# Patient Record
Sex: Male | Born: 1985 | Race: Black or African American | Hispanic: No | Marital: Single | Smoking: Never smoker
Health system: Southern US, Community
[De-identification: ages and names within clinical notes are randomized; demographics above are authoritative.]

## PROBLEM LIST (undated history)

## (undated) DIAGNOSIS — I4891 Unspecified atrial fibrillation: Secondary | ICD-10-CM

## (undated) DIAGNOSIS — G40909 Epilepsy, unspecified, not intractable, without status epilepticus: Secondary | ICD-10-CM

---
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
## 2007-04-21 ENCOUNTER — Emergency Department (HOSPITAL_COMMUNITY): Admission: EM | Admit: 2007-04-21 | Discharge: 2007-04-21 | Payer: Self-pay | Admitting: Emergency Medicine

## 2008-02-01 ENCOUNTER — Emergency Department (HOSPITAL_COMMUNITY): Admission: EM | Admit: 2008-02-01 | Discharge: 2008-02-02 | Payer: Self-pay | Admitting: Emergency Medicine

## 2008-09-20 IMAGING — CT CT HEAD W/O CM
1 of 2 series · 13 of 30 positions shown, 17 images · non-contrast
Comparison: none

CLINICAL DATA: Seizure. Headache.
 HEAD CT WITHOUT CONTRAST ? 02/11/06:
TECHNIQUE: Contiguous axial CT images were obtained from the base of the skull through the vertex according to standard protocol without contrast.

[Series 2: brain · axial · 0.47mm/px · z∈[+145,+268]mm · 13 of 28 slices shown, 17 images]
[im 2/28  brain]
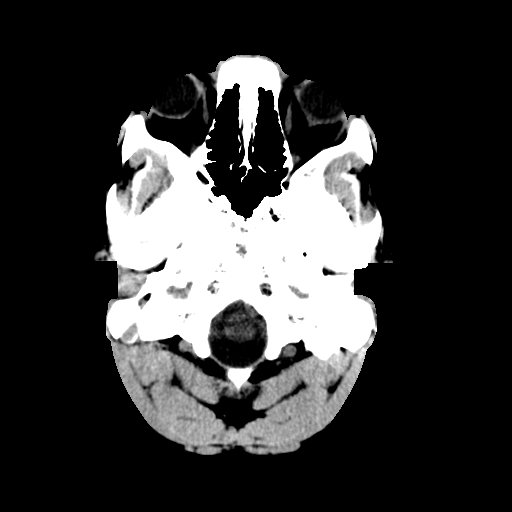
[im 2/28  bone]
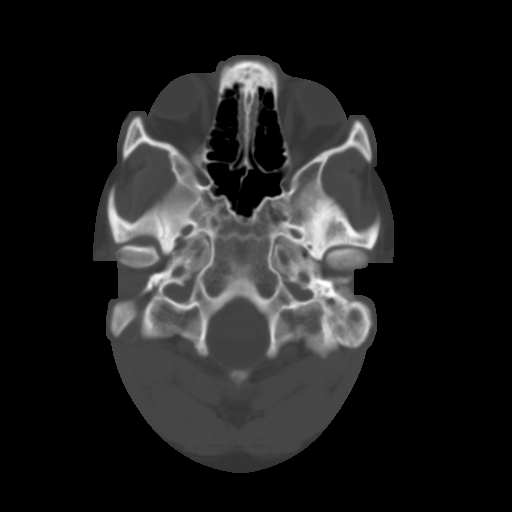
[im 4/28  brain]
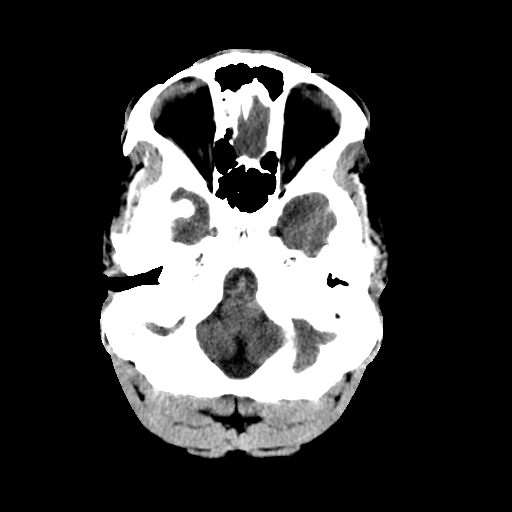
[im 6/28  brain]
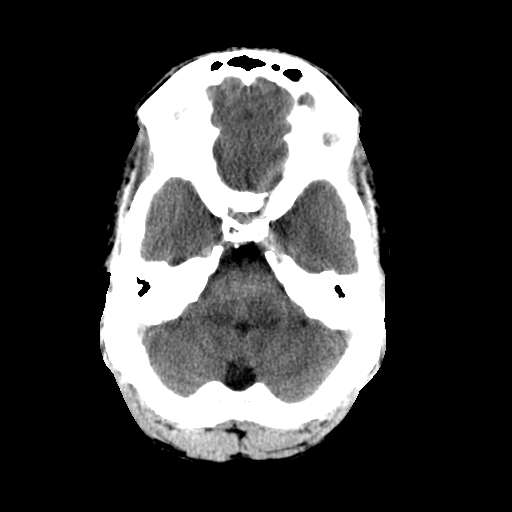
[im 8/28  brain]
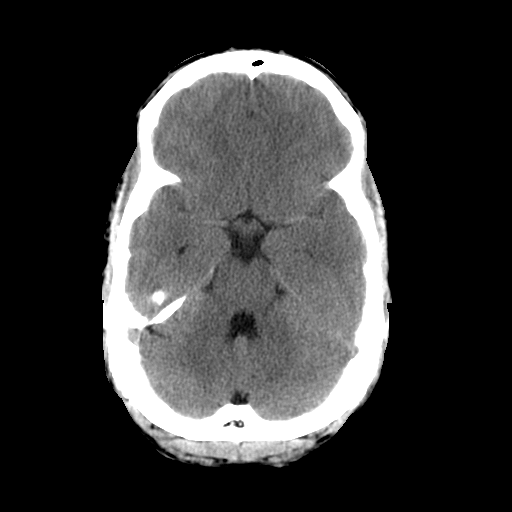
[im 10/28  brain]
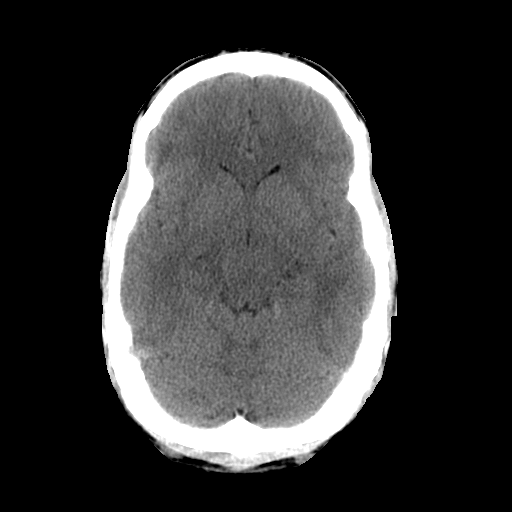
[im 10/28  bone]
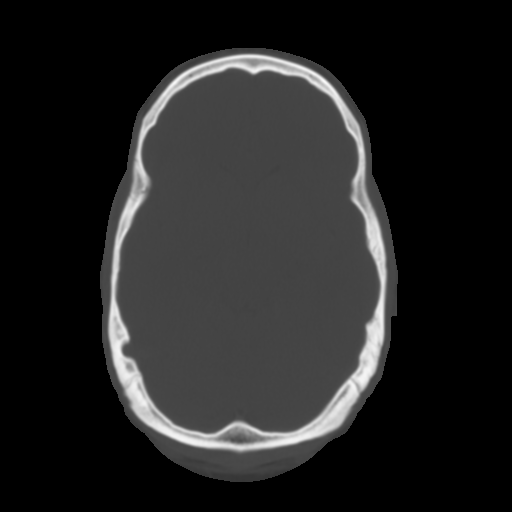
[im 12/28  brain]
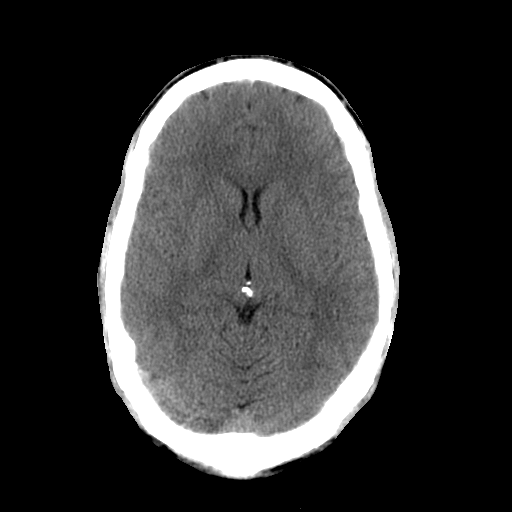
[im 14/28  brain]
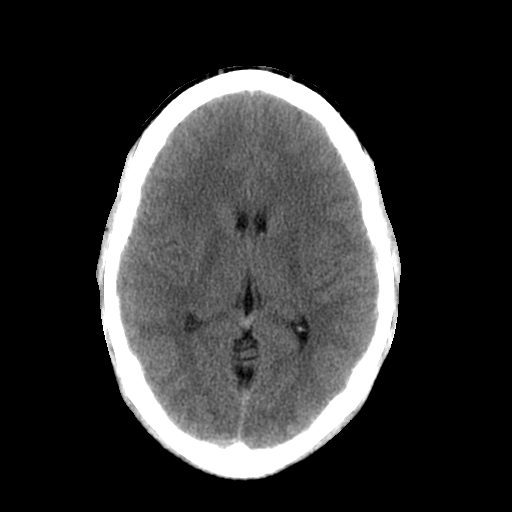
[im 16/28  brain]
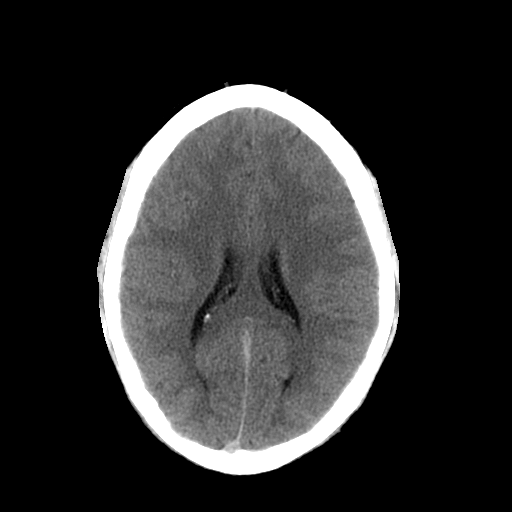
[im 18/28  brain]
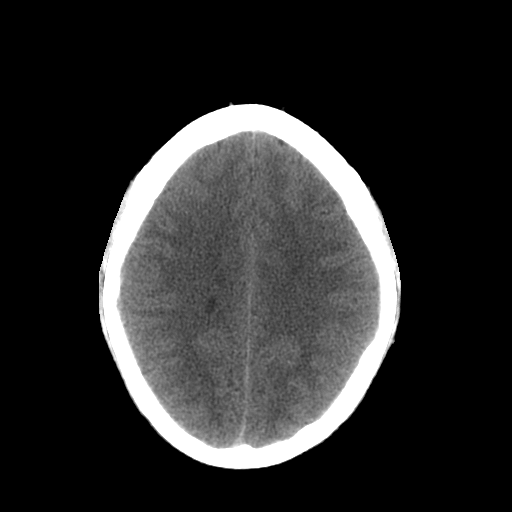
[im 18/28  bone]
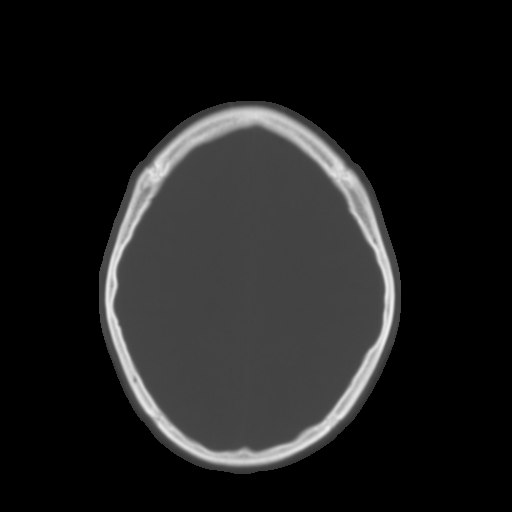
[im 20/28  brain]
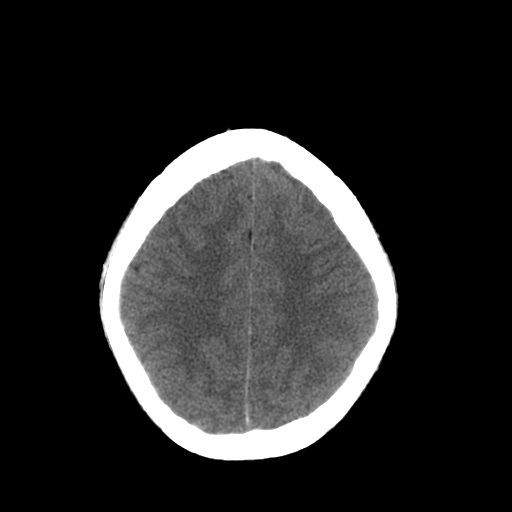
[im 22/28  brain]
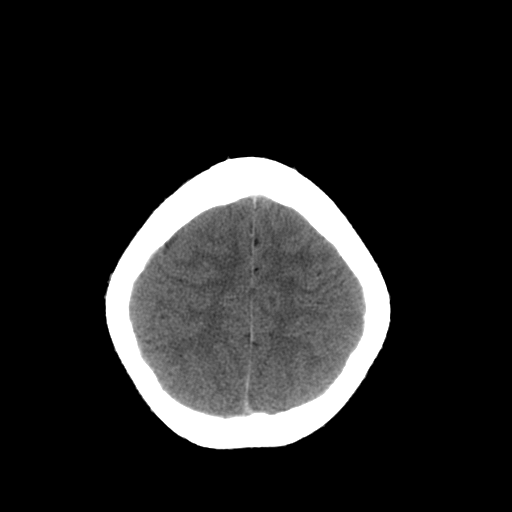
[im 24/28  brain]
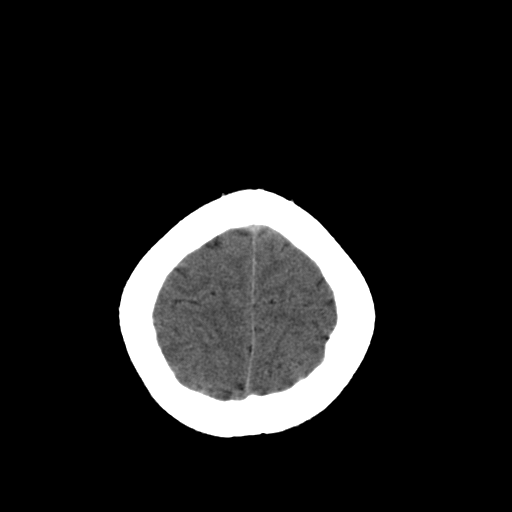
[im 26/28  brain]
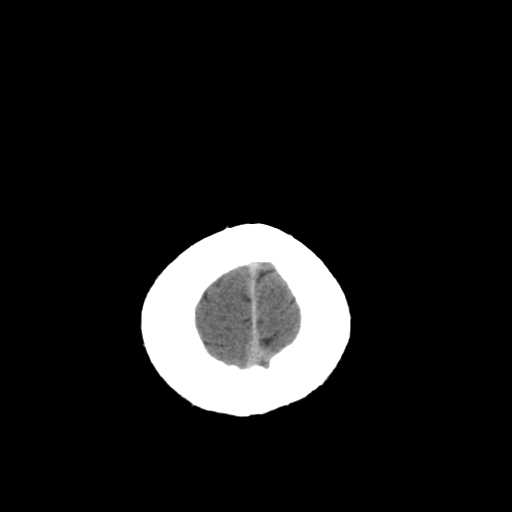
[im 26/28  bone]
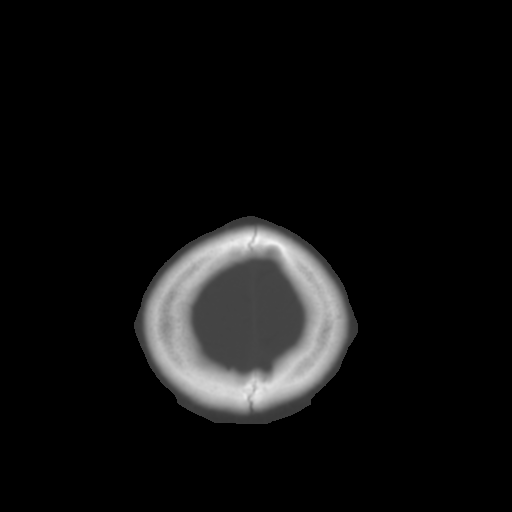

[13 of 30 positions shown; findings below may reference images not displayed]

FINDINGS: Axial scans from the base to the vertex were performed in the unenhanced state.   The ventricular system is normal in size and configuration and the septum is in a normal midline position.   The fourth ventricle and basilar cisterns appear normal.   No blood, edema, or mass effect is seen.  No bony abnormality is noted.
IMPRESSION: No acute intracranial abnormality.

## 2009-01-06 IMAGING — CT CT HEAD W/O CM
1 series · 16 of 30 positions shown, 20 images · IV contrast (agent unspecified)
Comparison: None.

CLINICAL DATA: Seizure/headache. 
 HEAD CT WITHOUT CONTRAST:
TECHNIQUE: Contiguous axial images were obtained from the base of the skull through the vertex according to standard protocol without contrast.

[Series 2: headseq 4.8 h45s · axial · 0.43mm/px · z∈[-157,-5]mm · 16 of 36 slices shown, 20 images]
[im 2/36  brain]
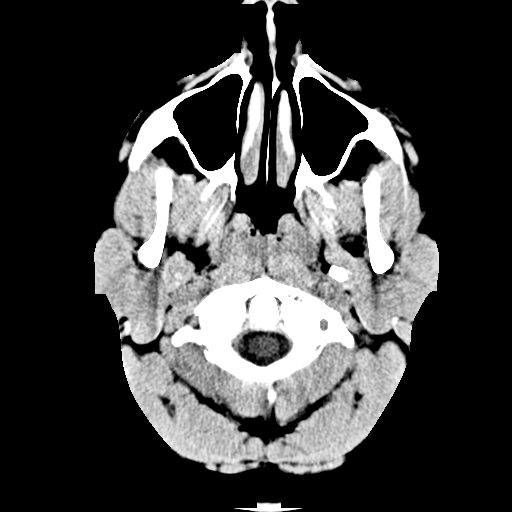
[im 2/36  bone]
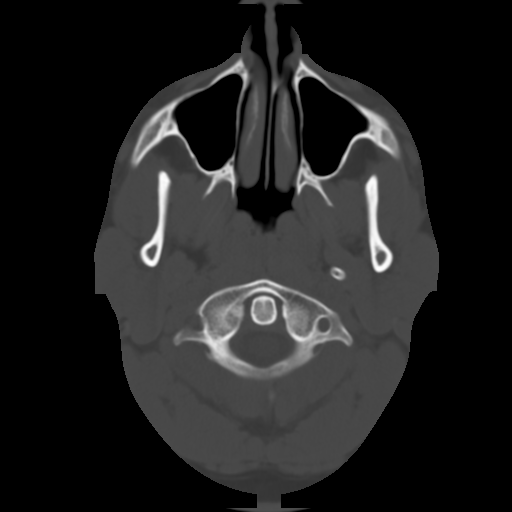
[im 4/36  brain]
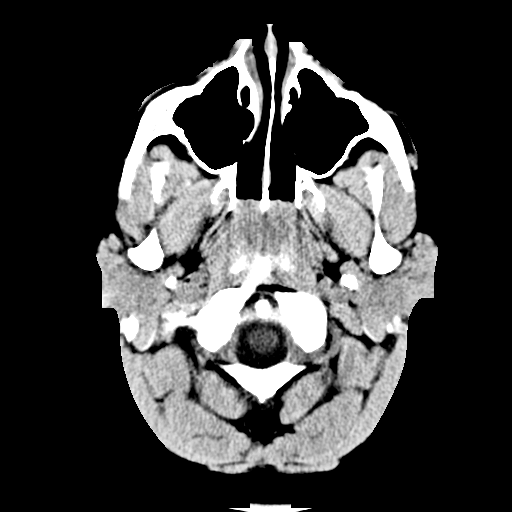
[im 7/36  brain]
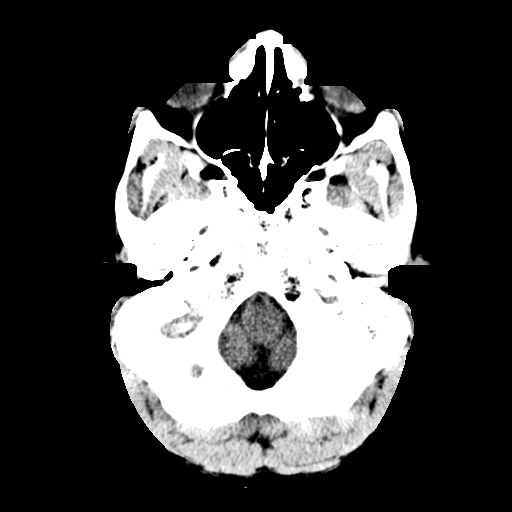
[im 9/36  brain]
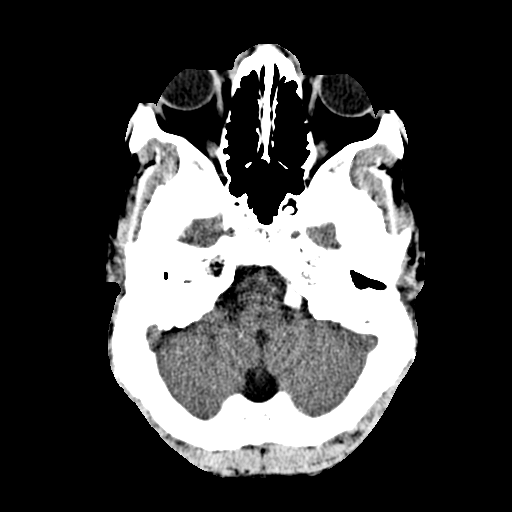
[im 10/36  brain]
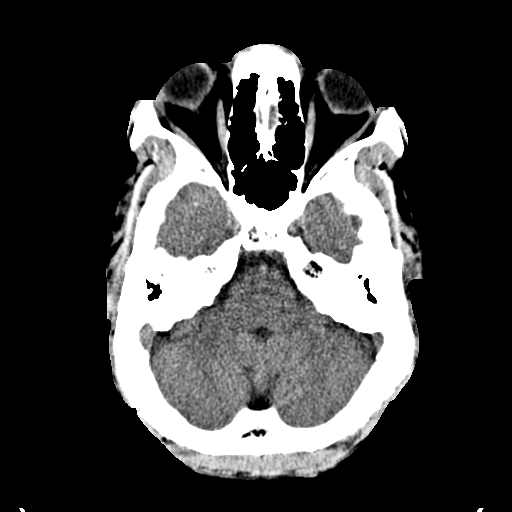
[im 10/36  bone]
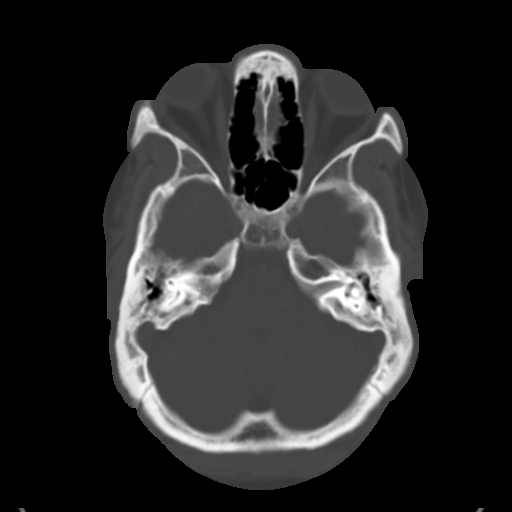
[im 13/36  brain]
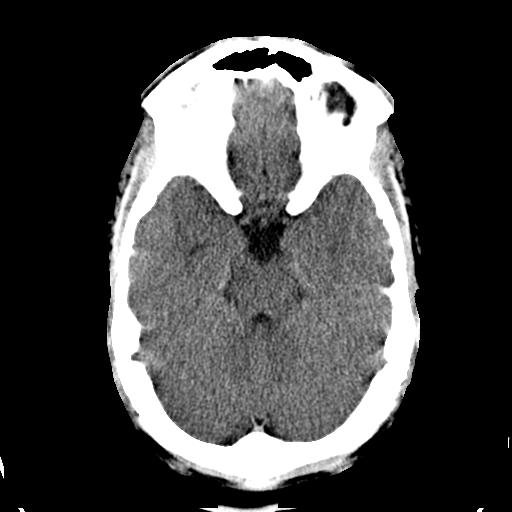
[im 15/36  brain]
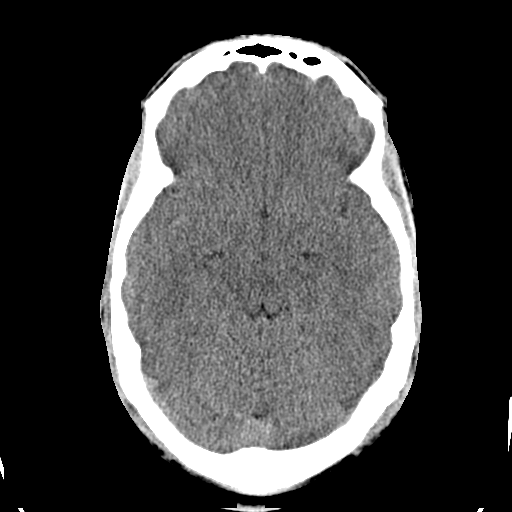
[im 17/36  brain]
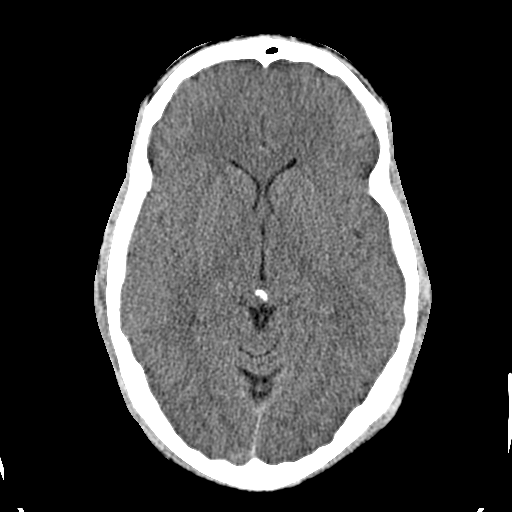
[im 19/36  brain]
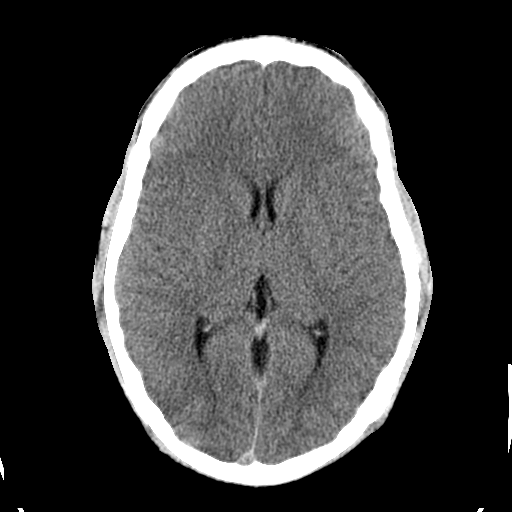
[im 19/36  bone]
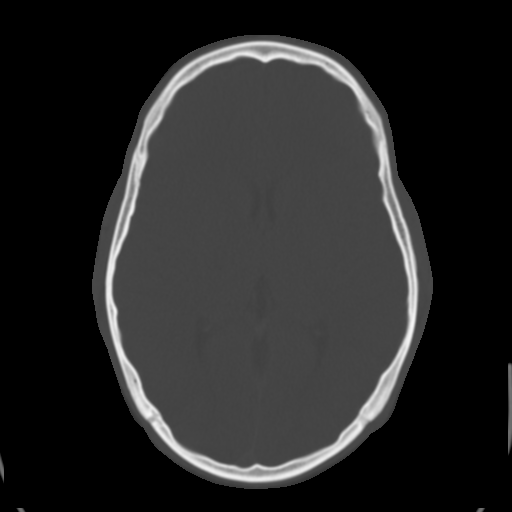
[im 21/36  brain]
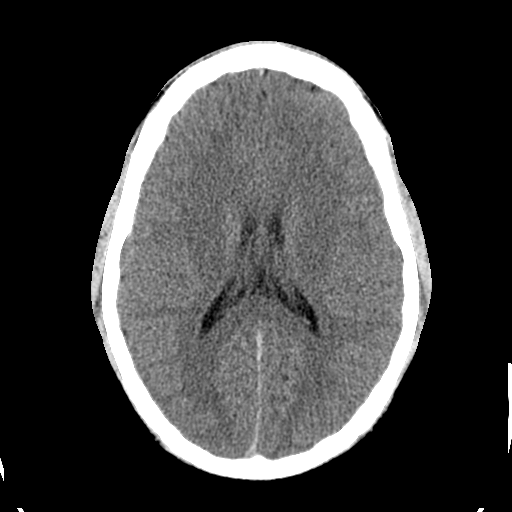
[im 23/36  brain]
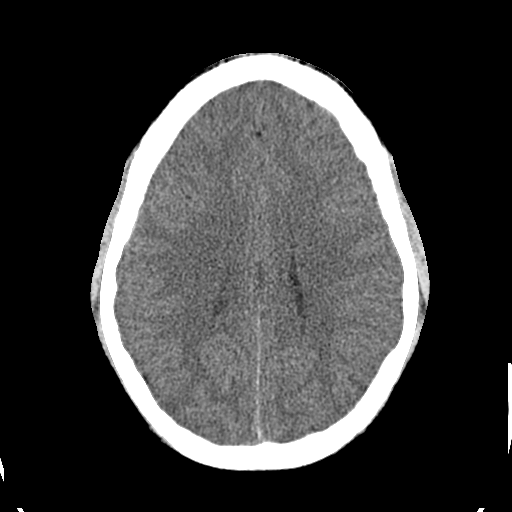
[im 26/36  brain]
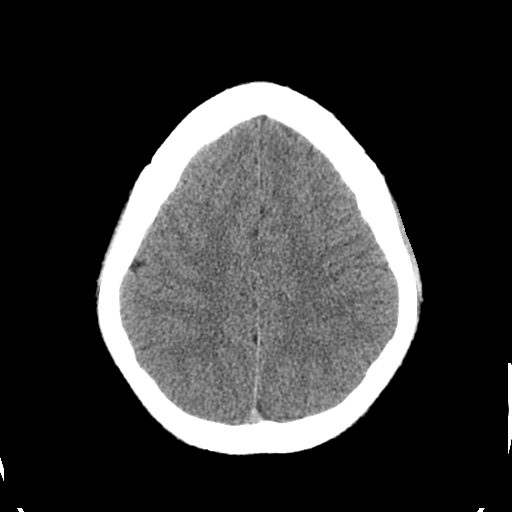
[im 27/36  brain]
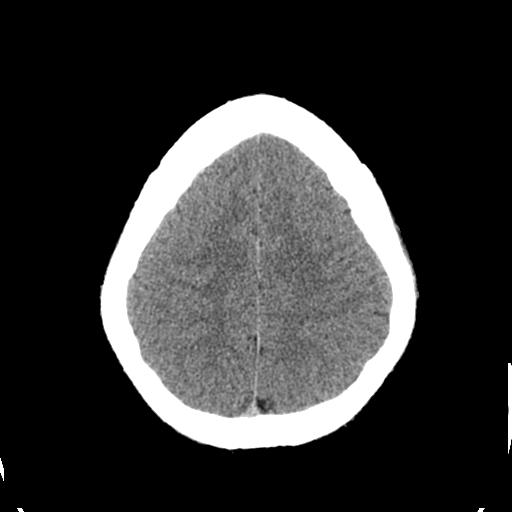
[im 27/36  bone]
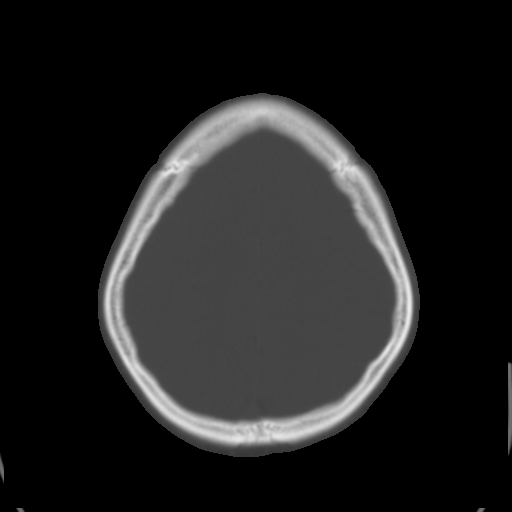
[im 29/36  brain]
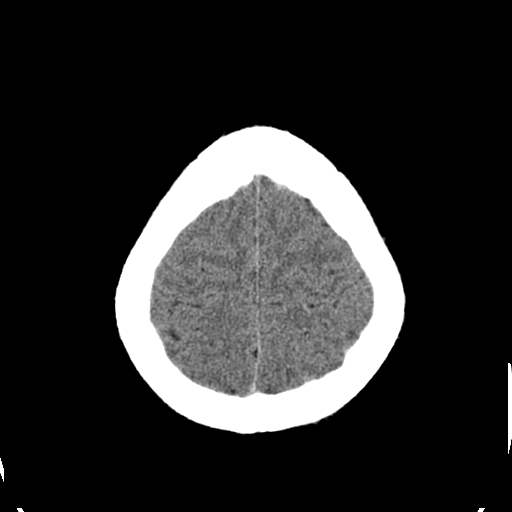
[im 32/36  brain]
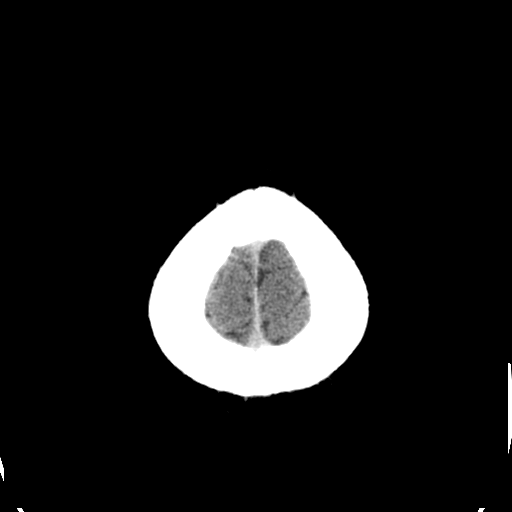
[im 34/36  brain]
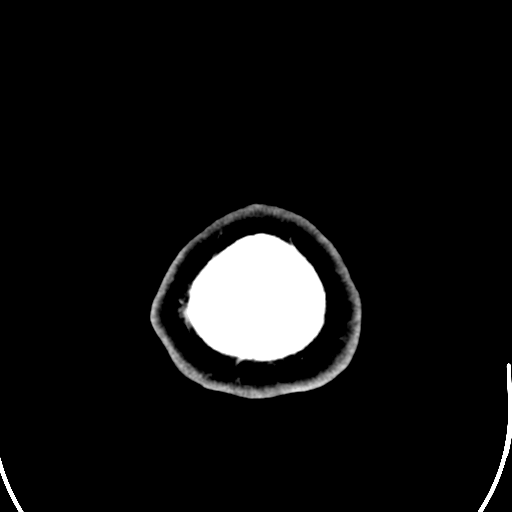

[16 of 30 positions shown; findings below may reference images not displayed]

FINDINGS: There is no evidence of intracranial hemorrhage, brain edema, acute infarct, mass lesion, or mass effect.  No other intra-axial abnormalities are seen, and the ventricles are within normal limits.  No abnormal extra-axial fluid collections or masses are identified.  No skull abnormalities are noted.
IMPRESSION: Negative non-contrast head CT.

## 2010-05-03 LAB — DIFFERENTIAL
Basophils Absolute: 0 10*3/uL (ref 0.0–0.1)
Lymphocytes Relative: 30 % (ref 12–46)
Lymphs Abs: 2 10*3/uL (ref 0.7–4.0)
Monocytes Absolute: 0.8 10*3/uL (ref 0.1–1.0)
Monocytes Relative: 11 % (ref 3–12)
Neutro Abs: 3.9 10*3/uL (ref 1.7–7.7)

## 2010-05-03 LAB — POCT I-STAT, CHEM 8
Calcium, Ion: 1.15 mmol/L (ref 1.12–1.32)
Glucose, Bld: 105 mg/dL — ABNORMAL HIGH (ref 70–99)
HCT: 47 % (ref 39.0–52.0)
Hemoglobin: 16 g/dL (ref 13.0–17.0)
Potassium: 4.3 mEq/L (ref 3.5–5.1)
Sodium: 140 mEq/L (ref 135–145)
TCO2: 23 mmol/L (ref 0–100)

## 2010-05-03 LAB — CBC
Hemoglobin: 15.2 g/dL (ref 13.0–17.0)
MCV: 89.8 fL (ref 78.0–100.0)
RBC: 5.07 MIL/uL (ref 4.22–5.81)
RDW: 12.8 % (ref 11.5–15.5)

## 2010-05-03 LAB — PHENYTOIN LEVEL, TOTAL: Phenytoin Lvl: <2.5 ug/mL — ABNORMAL LOW (ref 10.0–20.0)

## 2010-10-12 LAB — BASIC METABOLIC PANEL
BUN: 8
CO2: 26
Calcium: 9
Chloride: 105
Creatinine, Ser: 0.93
GFR calc Af Amer: >60
GFR calc non Af Amer: >60
Glucose, Bld: 102 — ABNORMAL HIGH
Potassium: 3.9
Sodium: 138

## 2010-10-12 LAB — PHENYTOIN LEVEL, TOTAL: Phenytoin Lvl: <2.5 — ABNORMAL LOW

## 2010-10-27 LAB — I-STAT 8, (EC8 V) (CONVERTED LAB)
Chloride: 104
HCT: 47
Operator id: 257131
TCO2: 26
pCO2, Ven: 47.9

## 2010-10-27 LAB — URINALYSIS, ROUTINE W REFLEX MICROSCOPIC
Bilirubin Urine: NEGATIVE
Glucose, UA: NEGATIVE
Hgb urine dipstick: NEGATIVE
Ketones, ur: 15 — AB
Nitrite: NEGATIVE
Protein, ur: 30 — AB
Urobilinogen, UA: 1

## 2010-10-27 LAB — URINE MICROSCOPIC-ADD ON

## 2010-10-27 LAB — PHENYTOIN LEVEL, TOTAL: Phenytoin Lvl: <2.5 — ABNORMAL LOW

## 2016-04-14 ENCOUNTER — Telehealth: Payer: Self-pay | Admitting: *Deleted

## 2016-04-14 NOTE — Telephone Encounter (Signed)
Per staff message, called pt to discuss drop in Hgb.  Pt stated she had no signs or symptoms of bleeding.  No blood in stools noted, last period was "light".  Pt will come in for lab redraw tomorrow 3/30.  Instructed pt to begin Iron Polysaccharide 150mg  BID with orange juice.  Will call pt with lab results.  Pt notified Dr. Candise CheKale will be out of town next week, will set up IV iron when he returns if needed.  Pt verbalized understanding.  Scheduling msg sent for lab apt, lab orders placed.

## 2022-06-20 ENCOUNTER — Emergency Department (HOSPITAL_BASED_OUTPATIENT_CLINIC_OR_DEPARTMENT_OTHER): Payer: 59

## 2022-06-20 ENCOUNTER — Other Ambulatory Visit: Payer: Self-pay

## 2022-06-20 ENCOUNTER — Other Ambulatory Visit (HOSPITAL_BASED_OUTPATIENT_CLINIC_OR_DEPARTMENT_OTHER): Payer: Self-pay

## 2022-06-20 ENCOUNTER — Emergency Department (HOSPITAL_BASED_OUTPATIENT_CLINIC_OR_DEPARTMENT_OTHER)
Admission: EM | Admit: 2022-06-20 | Discharge: 2022-06-20 | Disposition: A | Discharge status: Home / Self Care | Payer: 59 | Attending: Emergency Medicine | Admitting: Emergency Medicine

## 2022-06-20 ENCOUNTER — Encounter (HOSPITAL_BASED_OUTPATIENT_CLINIC_OR_DEPARTMENT_OTHER): Payer: Self-pay | Admitting: Emergency Medicine

## 2022-06-20 DIAGNOSIS — R509 Fever, unspecified: Secondary | ICD-10-CM

## 2022-06-20 DIAGNOSIS — Z20822 Contact with and (suspected) exposure to covid-19: Secondary | ICD-10-CM | POA: Diagnosis not present

## 2022-06-20 DIAGNOSIS — H6121 Impacted cerumen, right ear: Secondary | ICD-10-CM | POA: Diagnosis not present

## 2022-06-20 DIAGNOSIS — R002 Palpitations: Secondary | ICD-10-CM | POA: Insufficient documentation

## 2022-06-20 DIAGNOSIS — R590 Localized enlarged lymph nodes: Secondary | ICD-10-CM | POA: Diagnosis not present

## 2022-06-20 DIAGNOSIS — J392 Other diseases of pharynx: Secondary | ICD-10-CM | POA: Diagnosis not present

## 2022-06-20 DIAGNOSIS — R110 Nausea: Secondary | ICD-10-CM | POA: Insufficient documentation

## 2022-06-20 HISTORY — DX: Epilepsy, unspecified, not intractable, without status epilepticus: G40.909

## 2022-06-20 HISTORY — DX: Unspecified atrial fibrillation: I48.91

## 2022-06-20 LAB — URINALYSIS, W/ REFLEX TO CULTURE (INFECTION SUSPECTED)
Bacteria, UA: NONE SEEN
Bilirubin Urine: NEGATIVE
Glucose, UA: NEGATIVE mg/dL
Ketones, ur: 40 mg/dL — AB
Leukocytes,Ua: NEGATIVE
Nitrite: NEGATIVE
Protein, ur: 100 mg/dL — AB
Specific Gravity, Urine: 1.019 (ref 1.005–1.030)
pH: 6 (ref 5.0–8.0)

## 2022-06-20 LAB — CBC WITH DIFFERENTIAL/PLATELET
Abs Immature Granulocytes: 0.02 10*3/uL (ref 0.00–0.07)
Basophils Absolute: 0 10*3/uL (ref 0.0–0.1)
Basophils Relative: 1 %
Eosinophils Absolute: 0 10*3/uL (ref 0.0–0.5)
Eosinophils Relative: 0 %
HCT: 43.9 % (ref 39.0–52.0)
Hemoglobin: 15.1 g/dL (ref 13.0–17.0)
Immature Granulocytes: 0 %
Lymphocytes Relative: 8 %
Lymphs Abs: 0.5 10*3/uL — ABNORMAL LOW (ref 0.7–4.0)
MCH: 29.8 pg (ref 26.0–34.0)
MCHC: 34.4 g/dL (ref 30.0–36.0)
MCV: 86.8 fL (ref 80.0–100.0)
Monocytes Absolute: 1 10*3/uL (ref 0.1–1.0)
Monocytes Relative: 19 %
Neutro Abs: 4 10*3/uL (ref 1.7–7.7)
Neutrophils Relative %: 72 %
Platelets: 171 10*3/uL (ref 150–400)
RBC: 5.06 MIL/uL (ref 4.22–5.81)
RDW: 13.5 % (ref 11.5–15.5)
WBC: 5.6 10*3/uL (ref 4.0–10.5)
nRBC: 0 % (ref 0.0–0.2)

## 2022-06-20 LAB — COMPREHENSIVE METABOLIC PANEL
ALT: 11 U/L (ref 0–44)
AST: 16 U/L (ref 15–41)
Albumin: 4.7 g/dL (ref 3.5–5.0)
Alkaline Phosphatase: 54 U/L (ref 38–126)
Anion gap: 14 (ref 5–15)
BUN: 10 mg/dL (ref 6–20)
CO2: 23 mmol/L (ref 22–32)
Calcium: 9.3 mg/dL (ref 8.9–10.3)
Chloride: 97 mmol/L — ABNORMAL LOW (ref 98–111)
Creatinine, Ser: 1.04 mg/dL (ref 0.61–1.24)
GFR, Estimated: >60 mL/min (ref >60)
Glucose, Bld: 87 mg/dL (ref 70–99)
Potassium: 3.5 mmol/L (ref 3.5–5.1)
Sodium: 134 mmol/L — ABNORMAL LOW (ref 135–145)
Total Bilirubin: 0.9 mg/dL (ref 0.3–1.2)
Total Protein: 7.8 g/dL (ref 6.5–8.1)

## 2022-06-20 LAB — GROUP A STREP BY PCR: Group A Strep by PCR: NOT DETECTED

## 2022-06-20 LAB — RESP PANEL BY RT-PCR (RSV, FLU A&B, COVID)  RVPGX2
Influenza A by PCR: NEGATIVE
Influenza B by PCR: NEGATIVE
Resp Syncytial Virus by PCR: NEGATIVE
SARS Coronavirus 2 by RT PCR: NEGATIVE

## 2022-06-20 LAB — CULTURE, BLOOD (ROUTINE X 2)

## 2022-06-20 LAB — LACTIC ACID, PLASMA: Lactic Acid, Venous: 1.1 mmol/L (ref 0.5–1.9)

## 2022-06-20 LAB — HIV ANTIBODY (ROUTINE TESTING W REFLEX): HIV Screen 4th Generation wRfx: NONREACTIVE

## 2022-06-20 LAB — PROTIME-INR
INR: 1.1 (ref 0.8–1.2)
Prothrombin Time: 14.1 seconds (ref 11.4–15.2)

## 2022-06-20 LAB — APTT: aPTT: 25 seconds (ref 24–36)

## 2022-06-20 MED ORDER — DOXYCYCLINE HYCLATE 100 MG PO CAPS
100.0000 mg | ORAL_CAPSULE | Freq: Two times a day (BID) | ORAL | 0 refills | Status: DC
  Filled 2022-06-20: qty 20, 10d supply, fill #0

## 2022-06-20 MED ORDER — IBUPROFEN 800 MG PO TABS
800.0000 mg | ORAL_TABLET | Freq: Once | ORAL | Status: AC
  Administered 2022-06-20: 800 mg via ORAL
  Filled 2022-06-20: qty 1

## 2022-06-20 MED ORDER — DOXYCYCLINE HYCLATE 100 MG PO CAPS: 100.0000 mg | ORAL_CAPSULE | Freq: Two times a day (BID) | ORAL | 0 refills | Status: AC

## 2022-06-20 MED ORDER — ACETAMINOPHEN 325 MG PO TABS
650.0000 mg | ORAL_TABLET | Freq: Once | ORAL | Status: AC | PRN
  Administered 2022-06-20: 650 mg via ORAL
  Filled 2022-06-20: qty 2

## 2022-06-20 NOTE — ED Provider Notes (Signed)
[place] EMERGENCY DEPARTMENT AT [facility] Provider Note   CSN: [id number] Arrival date & time: 06/20/22  1135     History  Chief Complaint  Patient presents with   Fever    Darrell Hendricks is a 37 y.o. malepresents to the ED for chief complaint of fever x 1 day. While he was cooking dinner last night he suddenly felt warm and lightheaded. His wife took his temperature and stated that it was high at 104. At the onset of the fever he also started to feel nausea, palpitations, and experience full body chills. He has never had an illness similar to this before and is not aware of any sick contacts.  Patient denies any unprotected intercourse, sore throat, ear pain.  He denies recent foreign travel,vomiting or diarrhea   Fever      Home Medications Prior to Admission medications   Medication Sig Start Date End Date Taking? Authorizing Provider  levETIRAcetam (KEPPRA) 500 MG tablet Take 500 mg by mouth 2 (two) times daily. 05/27/22 06/26/22 Yes [provider]  doxycycline (VIBRAMYCIN) 100 MG capsule Take 1 capsule (100 mg total) by mouth 2 (two) times daily for 10 days. 06/20/22 06/30/22  Arthor Captain, PA-C  lisinopril-hydrochlorothiazide (ZESTORETIC) 10-12.5 MG tablet Take 1 tablet by mouth daily.    [provider]      Allergies    Patient has no known allergies.    Review of Systems   Review of Systems  Constitutional:  Positive for fever.    Physical Exam Updated Vital Signs BP (!) 171/94   Pulse 80   Temp 100.3 F (37.9 C) (Oral)   Resp 20   Ht 6\' 3"  (1.905 m)   Wt 117.9 kg   SpO2 97%   BMI 32.50 kg/m  Physical Exam Vitals and nursing note reviewed.  Constitutional:      General: He is not in acute distress.    Appearance: He is well-developed. He is not diaphoretic.  HENT:     Head: Normocephalic and atraumatic.     Right Ear: Hearing normal. There is impacted cerumen.     Left Ear: Hearing, tympanic membrane, ear canal and  external ear normal.     Mouth/Throat:     Pharynx: Uvula midline. Posterior oropharyngeal erythema and uvula swelling present.  Eyes:     General: No scleral icterus.    Conjunctiva/sclera: Conjunctivae normal.  Cardiovascular:     Rate and Rhythm: Normal rate and regular rhythm.     Heart sounds: Normal heart sounds.  Pulmonary:     Effort: Pulmonary effort is normal. No respiratory distress.     Breath sounds: Normal breath sounds.  Abdominal:     Palpations: Abdomen is soft.     Tenderness: There is no abdominal tenderness.  Musculoskeletal:     Cervical back: Normal range of motion and neck supple.  Lymphadenopathy:     Cervical: Cervical adenopathy present.  Skin:    General: Skin is warm and dry.  Neurological:     Mental Status: He is alert.  Psychiatric:        Behavior: Behavior normal.     ED Results / Procedures / Treatments   Labs (all labs ordered are listed, but only abnormal results are displayed) Labs Reviewed  COMPREHENSIVE METABOLIC PANEL - Abnormal; Notable for the following components:      Result Value   Sodium 134 (*)    Chloride 97 (*)    All other components within  normal limits  CBC WITH DIFFERENTIAL/PLATELET - Abnormal; Notable for the following components:   Lymphs Abs 0.5 (*)    All other components within normal limits  URINALYSIS, W/ REFLEX TO CULTURE (INFECTION SUSPECTED) - Abnormal; Notable for the following components:   Hgb urine dipstick MODERATE (*)    Ketones, ur 40 (*)    Protein, ur 100 (*)    All other components within normal limits  RESP PANEL BY RT-PCR (RSV, FLU A&B, COVID)  RVPGX2  CULTURE, BLOOD (ROUTINE X 2)  CULTURE, BLOOD (ROUTINE X 2)  GROUP A STREP BY PCR  LACTIC ACID, PLASMA  PROTIME-INR  APTT  HIV ANTIBODY (ROUTINE TESTING W REFLEX)  RPR    EKG EKG Interpretation  Date/Time:  Monday June 20 2022 15:08:09 EDT Ventricular Rate:  88 PR Interval:  143 QRS Duration: 101 QT Interval:  334 QTC  Calculation: 404 R Axis:   42 Text Interpretation: Sinus rhythm ST elev, probable normal early repol pattern Confirmed by Ernie Avena ([id number]) on 06/20/2022 4:40:22 PM  Radiology DG Chest Port 1 View  Result Date: 06/20/2022 CLINICAL DATA:  Questionable sepsis, evaluate for abnormality. EXAM: PORTABLE CHEST 1 VIEW COMPARISON:  None Available. FINDINGS: Clear lungs. Normal heart size and mediastinal contours. No pleural effusion or pneumothorax. Visualized bones and upper abdomen are unremarkable. IMPRESSION: No evidence of acute cardiopulmonary disease. Electronically Signed   By: Orvan Falconer M.D.   On: 06/20/2022 15:09    Procedures Procedures    Medications Ordered in ED Medications  acetaminophen (TYLENOL) tablet 650 mg (650 mg Oral Given 06/20/22 1156)  ibuprofen (ADVIL) tablet 800 mg (800 mg Oral Given 06/20/22 1508)    ED Course/ Medical Decision Making/ A&P Clinical Course as of 06/20/22 1851  Mon Jun 20, 2022  1504 Lymphocyte #(!): 0.5 [AH]    Clinical Course User Index [AH] Arthor Captain, PA-C                             Medical Decision Making Amount and/or Complexity of Data Reviewed Labs: ordered. Decision-making details documented in ED Course. Radiology: ordered and independent interpretation performed. ECG/medicine tests: ordered and independent interpretation performed.  Risk OTC drugs. Prescription drug management.   37 year old male who presents emergency department for fever of unknown origin. Differential diagnosis includes infectious, inflammatory or autoimmune disorders.  Patient states that he does not have risk factors for communicable disease such as HIV or syphilis however we have drawn labs for rule out of acute HIV infection.  He does have some erythema of the throat and may have some early viral infection however he does not appear to have strep throat.  His respiratory panel was negative for influenza COVID RSV and he is not having any respiratory  symptoms at this time.  He has no neck stiffness or headache suggestive of meningitis.  I reviewed the patient's urine he does not appear to have any evidence of infection at this time.  Patient's white count is within normal limits.  I visualized and interpreted the patient's chest x-ray which shows no acute findings.  I reviewed all labs as just discussed.  EKG shows sinus rhythm at a rate of 88.  At this time I am unsure of the cause of the patient's fever.  Given his flulike symptoms and fever will treat for potential RMSF with 10-day course of doxycycline.  Discussed outpatient follow-up and return precautions.  Patient appears otherwise appropriate for discharge  at this time        Final Clinical Impression(s) / ED Diagnoses Final diagnoses:  Fever of unknown origin (FUO)    Rx / DC Orders ED Discharge Orders          Ordered    doxycycline (VIBRAMYCIN) 100 MG capsule  2 times daily,   Status:  Discontinued        06/20/22 1652    doxycycline (VIBRAMYCIN) 100 MG capsule  2 times daily        06/20/22 1652              Arthor Captain, PA-C 06/20/22 1852    Rondel Baton, MD 06/23/22 1019

## 2022-06-20 NOTE — ED Notes (Signed)
Urine cup given and pt advised that UA is needed.  IV started in left hand

## 2022-06-20 NOTE — ED Triage Notes (Signed)
Pt arrives to ED with c/o fever that started last night. Associated with nausea.

## 2022-06-20 NOTE — ED Notes (Signed)
.  dis

## 2022-06-20 NOTE — Discharge Instructions (Signed)
Contact a health care provider if: You have vomiting or diarrhea that does not get better. You cannot eat or drink without vomiting. You have pain when you pee (urinate). Your symptoms do not get better with treatment or you have new symptoms. You have a skin rash. You have signs of dehydration, such as: Dark pee, very little pee, or no pee. Cracked lips or dry mouth. Sunken eyes. Sleepiness. Weakness. Get help right away if: You have shortness of breath or trouble breathing. You feel dizzy or you faint. You are confused and do not know the time of day, where you are, or who you are (disoriented). You have severe pain in your abdomen. These symptoms may be an emergency. Get help right away. Call 911.

## 2022-06-21 LAB — RPR: RPR Ser Ql: NONREACTIVE

## 2022-06-22 ENCOUNTER — Encounter: Payer: Self-pay | Admitting: Neurology

## 2022-06-23 LAB — CULTURE, BLOOD (ROUTINE X 2)

## 2022-06-24 LAB — CULTURE, BLOOD (ROUTINE X 2)

## 2022-06-25 LAB — CULTURE, BLOOD (ROUTINE X 2)
Culture: NO GROWTH
Special Requests: ADEQUATE

## 2022-07-18 ENCOUNTER — Other Ambulatory Visit (INDEPENDENT_AMBULATORY_CARE_PROVIDER_SITE_OTHER): Payer: 59

## 2022-07-18 ENCOUNTER — Ambulatory Visit: Payer: 59 | Admitting: Neurology

## 2022-07-18 ENCOUNTER — Encounter: Payer: Self-pay | Admitting: Neurology

## 2022-07-18 VITALS — BP 192/92 | HR 65 | Ht 74.0 in | Wt 260.4 lb

## 2022-07-18 DIAGNOSIS — G40909 Epilepsy, unspecified, not intractable, without status epilepticus: Secondary | ICD-10-CM

## 2022-07-18 DIAGNOSIS — I4891 Unspecified atrial fibrillation: Secondary | ICD-10-CM

## 2022-07-18 LAB — COMPREHENSIVE METABOLIC PANEL
ALT: 18 U/L (ref 0–53)
AST: 13 U/L (ref 0–37)
Albumin: 3.9 g/dL (ref 3.5–5.2)
Alkaline Phosphatase: 69 U/L (ref 39–117)
BUN: 13 mg/dL (ref 6–23)
CO2: 28 mEq/L (ref 19–32)
Calcium: 9.1 mg/dL (ref 8.4–10.5)
Chloride: 105 mEq/L (ref 96–112)
Creatinine, Ser: 1.28 mg/dL (ref 0.40–1.50)
GFR: 72.74 mL/min (ref >60.00)
Glucose, Bld: 93 mg/dL (ref 70–99)
Potassium: 4 mEq/L (ref 3.5–5.1)
Sodium: 141 mEq/L (ref 135–145)
Total Bilirubin: 0.5 mg/dL (ref 0.2–1.2)
Total Protein: 6.8 g/dL (ref 6.0–8.3)

## 2022-07-18 LAB — CBC
HCT: 40 % (ref 39.0–52.0)
Hemoglobin: 13.4 g/dL (ref 13.0–17.0)
MCHC: 33.5 g/dL (ref 30.0–36.0)
MCV: 89.7 fl (ref 78.0–100.0)
Platelets: 214 10*3/uL (ref 150.0–400.0)
RBC: 4.46 Mil/uL (ref 4.22–5.81)
RDW: 14.9 % (ref 11.5–15.5)
WBC: 4.8 10*3/uL (ref 4.0–10.5)

## 2022-07-18 MED ORDER — LEVETIRACETAM 500 MG PO TABS: 500.0000 mg | ORAL_TABLET | Freq: Two times a day (BID) | ORAL | 3 refills | Status: DC

## 2022-07-18 NOTE — Patient Instructions (Signed)
Good to meet you.  Schedule 1-hour EEG  2. Have bloodwork done for CBC, CMP  3. Continue Levetiracetam (Keppra) 500mg  twice a day  4. Referral will be sent to Cardiology  5. Please schedule an appointment with a new primary care doctor  6. Follow-up in 3 months, call for any changes   Seizure Precautions: 1. If medication has been prescribed for you to prevent seizures, take it exactly as directed.  Do not stop taking the medicine without talking to your doctor first, even if you have not had a seizure in a long time.   2. Avoid activities in which a seizure would cause danger to yourself or to others.  Don't operate dangerous machinery, swim alone, or climb in high or dangerous places, such as on ladders, roofs, or girders.  Do not drive unless your doctor says you may.  3. If you have any warning that you may have a seizure, lay down in a safe place where you can't hurt yourself.    4.  No driving for 6 months from last seizure, as per [facility].   Please refer to the following link on the Epile[employer] Foundation of America's website for more information: http://www.epilepsyfoundation.org/answerplace/Social/driving/drivingu.cfm   5.  Maintain good sleep hygiene. Avoid alcohol.  6.  Contact your doctor if you have any problems that may be related to the medicine you are taking.  7.  Call 911 and bring the patient back to the ED if:        A.  The seizure lasts longer than 5 minutes.       B.  The patient doesn't awaken shortly after the seizure  C.  The patient has new problems such as difficulty seeing, speaking or moving  D.  The patient was injured during the seizure  E.  The patient has a temperature over 102 F (39C)  F.  The patient vomited and now is having trouble breathing

## 2022-07-18 NOTE — Progress Notes (Unsigned)
NEUROLOGY CONSULTATION NOTE  Darrell Hendricks MRN: [id number] DOB: 06-May-1985  Referring provider: Dr. Lubertha South Primary care provider: none  Reason for consult:  establish care for seizures  Dear Dr Freida Busman:  Thank you for your kind referral of Darrell Hendricks for consultation of the above symptoms. Although his history is well known to you, please allow me to reiterate it for the purpose of our medical record. The patient was accompanied to the clinic by his girlfriend Alcario Drought who also provides collateral information. Records and images were personally reviewed where available.  HISTORY OF PRESENT ILLNESS: This is a pleasant 37 year old right-handed man with a history of hypertension, atrial fibrillation, seizures since age 30, presenting to establish care for seizures. He reports seizures started at age 65, he recalls he was getting ready for work when he started having vision changes seeing bright flashing lights, then waking up in the ambulance. He recalls seeing neurologist Dr. Vickey Huger and starting Dilantin. He would tend to have a seizure when he missed medication. Seizures typically start with visual aura followed by convulsion. He was living in [place] in May 2021 when he had a seizure possibly in sleep and woke up in the middle of the night disoriented so his sister called EMS. He was noted to have atrial fibrillation with RVR in the ER and started on Cardizem drip. Per notes, CHA2DS2-VASc score was 0, anticoagulation was not required. He was started on Flecainide and Metoprolol, however he did not follow-up with Cardiology. He was also switched from Dilantin to Levetiracetam 500mg  BID.  He reports seizures are controlled as long as he takes his medication. He had a seizure in winter 2023 and most recently in April 2024 after missing medication. He moved to [place] in February and lives with his girlfriend Alcario Drought with family close by. She denies any staring/unresponsive episodes. He  has occasional visual auras in isolation with no associated confusion or headache. He denies any olfactory/gustatory hallucinations, focal numbness/tingling/weakness. Sometimes he has a muscle spasm, no clear myoclonic jerks. He denies any headaches, dizziness, diplopia, dysarthria/dysphagia, neck pain, bowel/bladder dysfunction. He has back pain from standing a lot at work. He works as a Investment banker, operational.    Erica RH 37yo: awake getting ready to go to work in PM, talking, started double vision, bright flashing lights, then woke in ambulance Saw Neuro: Dr. Vickey Huger, started Dilantin x several yrs, then moved to [facility] had another seizure (tend to have sz when he forgets meds); 2021 had another sz woke up in middle of night disoriented, switched to LEV and none since Sometimes has vision calms down breathe and goes away, not a lot; no confusion; no HA Body is sore, tongue bitten, no incont No focal weakness, no myoclonic but every now and then spasm, some in sleep No halluc ROS: no HAs, dizziness, no diplopi, no dys/dyspha, back pain from standing; no bowel/bladder Sleep 5-6hrs Sleep dep may be a trigger, thinks I'm focused but I'm not focus RF: maternal grandmother;  No side effects; recently both legs swelling, no diff breathig;  Lives with erica Social drinker; not a trigger Last sz was in April; woke up and walked in a room, vocalizes, arms flexed, fell and head was turned to the right side; missed med Last was winter 2023 in [place] Works as a Investment banker, operational; works in [employer] fund[employer] No staring/unresp Has noticed some hearing loss Pitting edema bilat, occl palps +2, 5/5, 3/3  Seizure symptoms: The patient denies any olfactory/gustatory  hallucinations, deja vu, rising epigastric sensation, focal numbness/tingling/weakness, myoclonic jerks.  Epilepsy Risk Factors:  *** had a normal birth and early development.  There is no history of febrile convulsions, CNS infections such as meningitis/encephalitis,  significant traumatic brain injury, neurosurgical procedures, or family history of seizures.  Prior AEDs: Laboratory Data:  EEGs: MRI:   PAST MEDICAL HISTORY: Past Medical History:  Diagnosis Date   Atrial fibrillation (HCC)    Epilepsy (HCC)     PAST SURGICAL HISTORY: History reviewed. No pertinent surgical history.  MEDICATIONS: Current Outpatient Medications on File Prior to Visit  Medication Sig Dispense Refill   levETIRAcetam (KEPPRA) 500 MG tablet Take 500 mg by mouth 2 (two) times daily.     lisinopril-hydrochlorothiazide (ZESTORETIC) 10-12.5 MG tablet Take 1 tablet by mouth daily.     No current facility-administered medications on file prior to visit.    ALLERGIES: No Known Allergies  FAMILY HISTORY: Family History  Problem Relation Age of Onset   Hypertension Mother    Hypertension Maternal Grandmother    Seizures Maternal Grandmother    Hypertension Maternal Grandfather     SOCIAL HISTORY: Social History   Socioeconomic History   Marital status: Single    Spouse name: Not on file   Number of children: Not on file   Years of education: Not on file   Highest education level: Not on file  Occupational History   Not on file  Tobacco Use   Smoking status: Never   Smokeless tobacco: Never  Vaping Use   Vaping Use: Some days  Substance and Sexual Activity   Alcohol use: Yes   Drug use: Yes    Types: Marijuana   Sexual activity: Not on file  Other Topics Concern   Not on file  Social History Narrative   Are you right handed or left handed? Right handed    Are you currently employed ? 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 No    Who lives with you? Girl friend    What type of home do you live in: 1 story or 2 story?  1 story        Social Determinants of Corporate investment banker Strain: Not on file  Food Insecurity: Not on file  Transportation Needs: Not on file  Physical Activity: Not on file  Stress:  Not on file  Social Connections: Not on file  Intimate Partner Violence: Not on file     PHYSICAL EXAM: Vitals:   07/18/22 1034 07/18/22 1043  BP: (!) 165/89 (!) 192/92  Pulse: 65   SpO2: 99%    General: No acute distress Head:  Normocephalic/atraumatic Skin/Extremities: No rash, no edema Neurological Exam: Mental status: alert and oriented to person, place, and time, no dysarthria or aphasia, Fund of knowledge is appropriate.  Recent and remote memory are intact.  Attention and concentration are normal.    Able to name objects and repeat phrases. Cranial nerves: CN I: not tested CN II: pupils equal, round and reactive to light, visual fields intact CN III, IV, VI:  full range of motion, no nystagmus, no ptosis CN V: facial sensation intact CN VII: upper and lower face symmetric CN VIII: hearing intact to conversation Bulk & Tone: normal, no fasciculations. Motor: 5/5 throughout with no pronator drift. Sensation: intact to light touch, cold, pin, vibration and joint position sense.  No extinction to double simultaneous stimulation.  Romberg test *** Deep  Tendon Reflexes: +2 throughout, no ankle clonus Plantar responses: downgoing bilaterally Cerebellar: no incoordination on finger to nose, heel to shin. No dysdiadochokinesia Gait: narrow-based and steady, able to tandem walk adequately. Tremor: ***   IMPRESSION: This is a *** year old ***-handed *** with a history of ***.  [place] driving laws were discussed with the patient, and *** knows to stop driving after a seizure, until 6 months seizure-free.    The duration of this appointment visit was *** minutes of face-to-face time with the patient.  Greater than 50% of this time was spent in counseling, explanation of diagnosis, planning of further management, and coordination of care.  Thank you for allowing me to participate in the care of this patient. Please do not hesitate to call for any questions or concerns.   Patrcia Dolly, M.D.  CC: ***

## 2022-07-22 ENCOUNTER — Telehealth: Payer: Self-pay

## 2022-07-22 NOTE — Telephone Encounter (Signed)
-----   Message from Van Clines, MD sent at 07/19/2022 12:46 PM EDT ----- Pls let them know bloodwork normal, kidney and liver function normal. If leg swelling worsening and no earlier PCP appt, recommend Urgent Care eval, thanks

## 2022-07-22 NOTE — Telephone Encounter (Signed)
Pt called informed bloodwork normal, kidney and liver function normal. If leg swelling worsening and no earlier PCP appt, recommend Urgent Care eval

## 2022-07-26 ENCOUNTER — Ambulatory Visit: Payer: 59 | Admitting: Neurology

## 2022-07-26 DIAGNOSIS — G40909 Epilepsy, unspecified, not intractable, without status epilepticus: Secondary | ICD-10-CM | POA: Diagnosis not present

## 2022-08-02 NOTE — Procedures (Signed)
ELECTROENCEPHALOGRAM REPORT  Date of Study: 07/26/2022  Patient's Name: Darrell Hendricks MRN: [id number] Date of Birth: [birth year]  Referring Provider: Dr. Patrcia Dolly  Clinical History: This is a 37 year old man with visual auras preceding seizures. EEG for classification.  Medications: Keppra, Zestoretic  Technical Summary: A multichannel digital 1-hour EEG recording measured by the international 10-20 system with electrodes applied with paste and impedances below 5000 ohms performed in our laboratory with EKG monitoring in an awake and asleep patient.  Hyperventilation was not performed. Photic stimulation was performed.  The digital EEG was referentially recorded, reformatted, and digitally filtered in a variety of bipolar and referential montages for optimal display.    Description: The patient is awake and asleep during the recording.  During maximal wakefulness, there is a symmetric, medium voltage 10 Hz posterior dominant rhythm that attenuates with eye opening.  The record is symmetric.  During drowsiness and sleep, there is an increase in theta slowing of the background.  Vertex waves and symmetric sleep spindles were seen. Photic stimulation did not elicit any abnormalities.  There were no epileptiform discharges or electrographic seizures seen.    EKG lead was unremarkable.  Impression: This 1-hour awake and asleep EEG is normal.    Clinical Correlation: A normal EEG does not exclude a clinical diagnosis of epilepsy.  If further clinical questions remain, prolonged EEG may be helpful.  Clinical correlation is advised.   Patrcia Dolly, M.D.

## 2022-09-27 ENCOUNTER — Ambulatory Visit: Payer: 59 | Attending: Cardiovascular Disease | Admitting: Cardiovascular Disease

## 2022-10-24 ENCOUNTER — Ambulatory Visit: Payer: 59 | Admitting: Neurology

## 2022-10-24 DIAGNOSIS — Z029 Encounter for administrative examinations, unspecified: Secondary | ICD-10-CM

## 2022-10-25 ENCOUNTER — Encounter: Payer: Self-pay | Admitting: Neurology

## 2022-11-24 NOTE — Progress Notes (Deleted)
  Cardiology Office Note:  .   Date:  11/24/2022  ID:  Darrell Hendricks, DOB [birth year], MRN [id number] PCP: Patient, No Pcp Per  [facility] Providers Cardiologist:  None { Click to update primary MD,subspecialty MD or APP then REFRESH:1}   History of Present Illness: .   Darrell Hendricks is a 37 y.o. male with history of pAF who presents for the evaluation of pAF at the request of Van Clines, MD.     Problem list Paroxysmal Afib -CHADSVASC=0 Seizures     ROS: All other ROS reviewed and negative. Pertinent positives noted in the HPI.     Studies Reviewed: Marland Kitchen        TTE 06/03/2019 [facility] Summary:  1. Global left ventricular wall motion and contractility are within  normal limits.  2. The EF is estimated at 50-55%.  3. There is mild sclerosis of the aortic valve cusps.  4. Abnormal diastolic filling pattern consistent with underlying atrial  fibrillation was observed.  5. The left atrium is moderately dilated.  6. There is no pericardial effusion.  Physical Exam:   VS:  There were no vitals taken for this visit.   Wt Readings from Last 3 Encounters:  07/18/22 260 lb 6.4 oz (118.1 kg)  06/20/22 260 lb (117.9 kg)    GEN: Well nourished, well developed in no acute distress NECK: No JVD; No carotid bruits CARDIAC: ***RRR, no murmurs, rubs, gallops RESPIRATORY:  Clear to auscultation without rales, wheezing or rhonchi  ABDOMEN: Soft, non-tender, non-distended EXTREMITIES:  No edema; No deformity  ASSESSMENT AND PLAN: .   ***    {Are you ordering a CV Procedure (e.g. stress test, cath, DCCV, TEE, etc)?   Press F2        :[id number]}   Follow-up: No follow-ups on file.  Time Spent with Patient: I have spent a total of *** minutes caring for this patient today face to face, ordering and reviewing labs/tests, reviewing prior records/medical history, examining the patient, establishing an assessment and plan, communicating results/findings to the patient/family, and  documenting in the medical record.   Signed, Lenna Gilford. Flora Lipps, MD, [facility]  [facility]  [address] [place], [place] [address] [phone]  8:42 PM

## 2022-11-25 ENCOUNTER — Ambulatory Visit: Payer: 59 | Attending: Cardiovascular Disease | Admitting: Cardiovascular Disease

## 2022-11-25 DIAGNOSIS — I480 Paroxysmal atrial fibrillation: Secondary | ICD-10-CM

## 2023-01-09 ENCOUNTER — Other Ambulatory Visit: Payer: Self-pay | Admitting: Neurology

## 2023-01-09 MED ORDER — LEVETIRACETAM 500 MG PO TABS: 500.0000 mg | ORAL_TABLET | Freq: Two times a day (BID) | ORAL | 2 refills | Status: DC

## 2023-08-15 ENCOUNTER — Other Ambulatory Visit: Payer: Self-pay | Admitting: Neurology

## 2023-11-08 ENCOUNTER — Other Ambulatory Visit: Payer: Self-pay | Admitting: Neurology

## 2023-12-05 ENCOUNTER — Other Ambulatory Visit: Payer: Self-pay | Admitting: Neurology

## 2023-12-06 ENCOUNTER — Encounter: Payer: Self-pay | Admitting: Neurology

## 2024-01-05 ENCOUNTER — Other Ambulatory Visit: Payer: Self-pay | Admitting: Neurology

## 2024-06-05 ENCOUNTER — Ambulatory Visit: Admitting: Neurology
